# Patient Record
Sex: Female | Born: 1950
Health system: Southern US, Community
[De-identification: ages and names within clinical notes are randomized; demographics above are authoritative.]

## PROBLEM LIST (undated history)

## (undated) HISTORY — PX: AUGMENTATION MAMMAPLASTY: SUR837

## (undated) HISTORY — PX: ABDOMINAL HYSTERECTOMY: SHX81

---

## 2004-10-13 ENCOUNTER — Ambulatory Visit: Payer: Self-pay | Admitting: Obstetrics and Gynecology

## 2005-10-05 ENCOUNTER — Ambulatory Visit: Payer: Self-pay

## 2005-11-03 ENCOUNTER — Ambulatory Visit: Payer: Self-pay

## 2005-11-05 ENCOUNTER — Ambulatory Visit: Payer: Self-pay | Admitting: Internal Medicine

## 2006-01-13 ENCOUNTER — Ambulatory Visit: Payer: Self-pay | Admitting: Internal Medicine

## 2006-04-05 ENCOUNTER — Ambulatory Visit: Payer: Self-pay | Admitting: Orthopaedic Surgery

## 2006-11-09 ENCOUNTER — Ambulatory Visit: Payer: Self-pay | Admitting: Internal Medicine

## 2007-11-14 ENCOUNTER — Ambulatory Visit: Payer: Self-pay | Admitting: Internal Medicine

## 2008-02-03 ENCOUNTER — Emergency Department: Payer: Self-pay | Admitting: Internal Medicine

## 2008-02-03 ENCOUNTER — Other Ambulatory Visit: Payer: Self-pay

## 2008-11-26 ENCOUNTER — Ambulatory Visit: Payer: Self-pay | Admitting: Unknown Physician Specialty

## 2008-11-27 ENCOUNTER — Ambulatory Visit: Payer: Self-pay | Admitting: Internal Medicine

## 2009-04-11 ENCOUNTER — Ambulatory Visit: Payer: Self-pay | Admitting: Internal Medicine

## 2009-04-22 ENCOUNTER — Ambulatory Visit: Payer: Self-pay | Admitting: Unknown Physician Specialty

## 2009-11-28 ENCOUNTER — Ambulatory Visit: Payer: Self-pay | Admitting: Internal Medicine

## 2010-05-23 ENCOUNTER — Ambulatory Visit: Payer: Self-pay | Admitting: Unknown Physician Specialty

## 2010-12-24 ENCOUNTER — Ambulatory Visit: Payer: Self-pay | Admitting: Internal Medicine

## 2011-12-28 ENCOUNTER — Ambulatory Visit: Payer: Self-pay | Admitting: Internal Medicine

## 2012-04-13 ENCOUNTER — Ambulatory Visit: Payer: Self-pay | Admitting: Internal Medicine

## 2012-12-30 ENCOUNTER — Ambulatory Visit: Payer: Self-pay | Admitting: Internal Medicine

## 2014-05-24 ENCOUNTER — Ambulatory Visit: Payer: Self-pay | Admitting: Internal Medicine

## 2014-09-03 ENCOUNTER — Other Ambulatory Visit: Payer: Self-pay

## 2014-09-03 DIAGNOSIS — Z1239 Encounter for other screening for malignant neoplasm of breast: Secondary | ICD-10-CM

## 2014-09-19 ENCOUNTER — Other Ambulatory Visit: Payer: Self-pay

## 2014-09-19 ENCOUNTER — Ambulatory Visit
Admission: RE | Admit: 2014-09-19 | Discharge: 2014-09-19 | Disposition: A | Discharge status: Home / Self Care | Payer: BLUE CROSS/BLUE SHIELD | Source: Ambulatory Visit | Attending: Internal Medicine | Admitting: Internal Medicine

## 2014-09-19 DIAGNOSIS — Z9882 Breast implant status: Secondary | ICD-10-CM | POA: Diagnosis not present

## 2014-09-19 DIAGNOSIS — Z1239 Encounter for other screening for malignant neoplasm of breast: Secondary | ICD-10-CM

## 2014-09-19 DIAGNOSIS — Z1231 Encounter for screening mammogram for malignant neoplasm of breast: Secondary | ICD-10-CM | POA: Insufficient documentation

## 2015-10-28 ENCOUNTER — Other Ambulatory Visit: Payer: Self-pay | Admitting: Internal Medicine

## 2015-10-28 DIAGNOSIS — Z1231 Encounter for screening mammogram for malignant neoplasm of breast: Secondary | ICD-10-CM

## 2015-11-11 ENCOUNTER — Ambulatory Visit: Payer: BLUE CROSS/BLUE SHIELD

## 2015-11-26 ENCOUNTER — Ambulatory Visit: Admission: RE | Admit: 2015-11-26 | Payer: BLUE CROSS/BLUE SHIELD | Source: Ambulatory Visit

## 2015-12-18 ENCOUNTER — Other Ambulatory Visit: Payer: Self-pay | Admitting: Internal Medicine

## 2015-12-18 ENCOUNTER — Ambulatory Visit
Admission: RE | Admit: 2015-12-18 | Discharge: 2015-12-18 | Disposition: A | Discharge status: Home / Self Care | Payer: BLUE CROSS/BLUE SHIELD | Source: Ambulatory Visit | Attending: Internal Medicine | Admitting: Internal Medicine

## 2015-12-18 DIAGNOSIS — Z1231 Encounter for screening mammogram for malignant neoplasm of breast: Secondary | ICD-10-CM | POA: Diagnosis not present

## 2016-03-12 ENCOUNTER — Other Ambulatory Visit: Payer: Self-pay | Admitting: Physician Assistant

## 2016-03-12 ENCOUNTER — Ambulatory Visit: Payer: BLUE CROSS/BLUE SHIELD

## 2016-03-12 DIAGNOSIS — R1011 Right upper quadrant pain: Secondary | ICD-10-CM

## 2016-09-08 DIAGNOSIS — Z01419 Encounter for gynecological examination (general) (routine) without abnormal findings: Secondary | ICD-10-CM | POA: Diagnosis not present

## 2016-09-08 DIAGNOSIS — Z1211 Encounter for screening for malignant neoplasm of colon: Secondary | ICD-10-CM | POA: Diagnosis not present

## 2016-09-08 DIAGNOSIS — Z8639 Personal history of other endocrine, nutritional and metabolic disease: Secondary | ICD-10-CM | POA: Diagnosis not present

## 2016-09-08 DIAGNOSIS — Z124 Encounter for screening for malignant neoplasm of cervix: Secondary | ICD-10-CM | POA: Diagnosis not present

## 2016-09-09 ENCOUNTER — Other Ambulatory Visit: Payer: Self-pay | Admitting: Obstetrics and Gynecology

## 2016-09-09 DIAGNOSIS — Z1231 Encounter for screening mammogram for malignant neoplasm of breast: Secondary | ICD-10-CM

## 2016-12-04 DIAGNOSIS — R1013 Epigastric pain: Secondary | ICD-10-CM | POA: Diagnosis not present

## 2016-12-07 ENCOUNTER — Other Ambulatory Visit: Payer: Self-pay | Admitting: Internal Medicine

## 2016-12-07 DIAGNOSIS — R1013 Epigastric pain: Secondary | ICD-10-CM

## 2016-12-10 ENCOUNTER — Ambulatory Visit
Admission: RE | Admit: 2016-12-10 | Discharge: 2016-12-10 | Disposition: A | Discharge status: Home / Self Care | Payer: PPO | Source: Ambulatory Visit | Attending: Internal Medicine | Admitting: Internal Medicine

## 2016-12-10 DIAGNOSIS — R1013 Epigastric pain: Secondary | ICD-10-CM | POA: Diagnosis not present

## 2016-12-16 DIAGNOSIS — R1013 Epigastric pain: Secondary | ICD-10-CM | POA: Diagnosis not present

## 2016-12-16 DIAGNOSIS — R1084 Generalized abdominal pain: Secondary | ICD-10-CM | POA: Diagnosis not present

## 2016-12-17 ENCOUNTER — Other Ambulatory Visit: Payer: Self-pay | Admitting: Internal Medicine

## 2016-12-17 DIAGNOSIS — R1084 Generalized abdominal pain: Secondary | ICD-10-CM

## 2016-12-21 ENCOUNTER — Ambulatory Visit
Admission: RE | Admit: 2016-12-21 | Discharge: 2016-12-21 | Disposition: A | Discharge status: Home / Self Care | Payer: PPO | Source: Ambulatory Visit | Attending: Obstetrics and Gynecology | Admitting: Obstetrics and Gynecology

## 2016-12-21 DIAGNOSIS — Z1231 Encounter for screening mammogram for malignant neoplasm of breast: Secondary | ICD-10-CM | POA: Insufficient documentation

## 2016-12-21 DIAGNOSIS — R1084 Generalized abdominal pain: Secondary | ICD-10-CM | POA: Diagnosis not present

## 2016-12-30 ENCOUNTER — Ambulatory Visit
Admission: RE | Admit: 2016-12-30 | Discharge: 2016-12-30 | Disposition: A | Discharge status: Home / Self Care | Payer: PPO | Source: Ambulatory Visit | Attending: Internal Medicine | Admitting: Internal Medicine

## 2016-12-30 DIAGNOSIS — D7389 Other diseases of spleen: Secondary | ICD-10-CM | POA: Insufficient documentation

## 2016-12-30 DIAGNOSIS — R1084 Generalized abdominal pain: Secondary | ICD-10-CM | POA: Diagnosis present

## 2016-12-30 DIAGNOSIS — K5730 Diverticulosis of large intestine without perforation or abscess without bleeding: Secondary | ICD-10-CM | POA: Diagnosis not present

## 2017-01-08 DIAGNOSIS — H43813 Vitreous degeneration, bilateral: Secondary | ICD-10-CM | POA: Diagnosis not present

## 2017-01-08 DIAGNOSIS — I10 Essential (primary) hypertension: Secondary | ICD-10-CM | POA: Diagnosis not present

## 2017-02-15 DIAGNOSIS — Z79899 Other long term (current) drug therapy: Secondary | ICD-10-CM | POA: Diagnosis not present

## 2017-02-15 DIAGNOSIS — I10 Essential (primary) hypertension: Secondary | ICD-10-CM | POA: Diagnosis not present

## 2017-02-15 DIAGNOSIS — Z0000 Encounter for general adult medical examination without abnormal findings: Secondary | ICD-10-CM | POA: Diagnosis not present

## 2017-02-15 DIAGNOSIS — E7800 Pure hypercholesterolemia, unspecified: Secondary | ICD-10-CM | POA: Diagnosis not present

## 2017-03-11 DIAGNOSIS — Z79899 Other long term (current) drug therapy: Secondary | ICD-10-CM | POA: Diagnosis not present

## 2017-03-11 DIAGNOSIS — I10 Essential (primary) hypertension: Secondary | ICD-10-CM | POA: Diagnosis not present

## 2017-03-11 DIAGNOSIS — E7800 Pure hypercholesterolemia, unspecified: Secondary | ICD-10-CM | POA: Diagnosis not present

## 2017-03-29 DIAGNOSIS — H1033 Unspecified acute conjunctivitis, bilateral: Secondary | ICD-10-CM | POA: Diagnosis not present

## 2017-04-05 DIAGNOSIS — H1033 Unspecified acute conjunctivitis, bilateral: Secondary | ICD-10-CM | POA: Diagnosis not present

## 2017-05-07 ENCOUNTER — Other Ambulatory Visit: Payer: Self-pay | Admitting: Internal Medicine

## 2017-05-07 DIAGNOSIS — Z1231 Encounter for screening mammogram for malignant neoplasm of breast: Secondary | ICD-10-CM

## 2017-07-22 DIAGNOSIS — L820 Inflamed seborrheic keratosis: Secondary | ICD-10-CM | POA: Diagnosis not present

## 2017-07-22 DIAGNOSIS — D229 Melanocytic nevi, unspecified: Secondary | ICD-10-CM | POA: Diagnosis not present

## 2017-07-30 DIAGNOSIS — E7800 Pure hypercholesterolemia, unspecified: Secondary | ICD-10-CM | POA: Diagnosis not present

## 2017-07-30 DIAGNOSIS — I10 Essential (primary) hypertension: Secondary | ICD-10-CM | POA: Diagnosis not present

## 2017-08-24 DIAGNOSIS — M79671 Pain in right foot: Secondary | ICD-10-CM | POA: Diagnosis not present

## 2017-09-10 DIAGNOSIS — E7800 Pure hypercholesterolemia, unspecified: Secondary | ICD-10-CM | POA: Diagnosis not present

## 2017-09-10 DIAGNOSIS — I10 Essential (primary) hypertension: Secondary | ICD-10-CM | POA: Diagnosis not present

## 2017-12-22 ENCOUNTER — Ambulatory Visit
Admission: RE | Admit: 2017-12-22 | Discharge: 2017-12-22 | Disposition: A | Discharge status: Home / Self Care | Payer: PPO | Source: Ambulatory Visit | Attending: Internal Medicine | Admitting: Internal Medicine

## 2017-12-22 DIAGNOSIS — Z1231 Encounter for screening mammogram for malignant neoplasm of breast: Secondary | ICD-10-CM | POA: Diagnosis not present

## 2018-04-13 DIAGNOSIS — E7800 Pure hypercholesterolemia, unspecified: Secondary | ICD-10-CM | POA: Diagnosis not present

## 2018-04-13 DIAGNOSIS — M79672 Pain in left foot: Secondary | ICD-10-CM | POA: Diagnosis not present

## 2018-04-13 DIAGNOSIS — Z79899 Other long term (current) drug therapy: Secondary | ICD-10-CM | POA: Diagnosis not present

## 2018-04-13 DIAGNOSIS — Z13820 Encounter for screening for osteoporosis: Secondary | ICD-10-CM | POA: Diagnosis not present

## 2018-04-13 DIAGNOSIS — I10 Essential (primary) hypertension: Secondary | ICD-10-CM | POA: Diagnosis not present

## 2018-05-10 DIAGNOSIS — M8588 Other specified disorders of bone density and structure, other site: Secondary | ICD-10-CM | POA: Diagnosis not present

## 2018-11-02 DIAGNOSIS — L239 Allergic contact dermatitis, unspecified cause: Secondary | ICD-10-CM | POA: Diagnosis not present

## 2019-01-30 DIAGNOSIS — I10 Essential (primary) hypertension: Secondary | ICD-10-CM | POA: Diagnosis not present

## 2019-01-30 DIAGNOSIS — K635 Polyp of colon: Secondary | ICD-10-CM | POA: Diagnosis not present

## 2019-01-30 DIAGNOSIS — Z79899 Other long term (current) drug therapy: Secondary | ICD-10-CM | POA: Diagnosis not present

## 2019-01-30 DIAGNOSIS — Z1239 Encounter for other screening for malignant neoplasm of breast: Secondary | ICD-10-CM | POA: Diagnosis not present

## 2019-01-30 DIAGNOSIS — E7800 Pure hypercholesterolemia, unspecified: Secondary | ICD-10-CM | POA: Diagnosis not present

## 2019-01-30 DIAGNOSIS — Z23 Encounter for immunization: Secondary | ICD-10-CM | POA: Diagnosis not present

## 2019-02-23 ENCOUNTER — Other Ambulatory Visit: Payer: Self-pay | Admitting: Internal Medicine

## 2019-02-23 DIAGNOSIS — Z79899 Other long term (current) drug therapy: Secondary | ICD-10-CM | POA: Diagnosis not present

## 2019-02-23 DIAGNOSIS — I10 Essential (primary) hypertension: Secondary | ICD-10-CM | POA: Diagnosis not present

## 2019-02-23 DIAGNOSIS — Z1231 Encounter for screening mammogram for malignant neoplasm of breast: Secondary | ICD-10-CM

## 2019-02-23 DIAGNOSIS — E7800 Pure hypercholesterolemia, unspecified: Secondary | ICD-10-CM | POA: Diagnosis not present

## 2019-05-03 DIAGNOSIS — Z86010 Personal history of colonic polyps: Secondary | ICD-10-CM | POA: Diagnosis not present

## 2019-05-03 DIAGNOSIS — Z8371 Family history of colonic polyps: Secondary | ICD-10-CM | POA: Diagnosis not present

## 2019-05-03 DIAGNOSIS — Z01812 Encounter for preprocedural laboratory examination: Secondary | ICD-10-CM | POA: Diagnosis not present

## 2019-05-18 ENCOUNTER — Ambulatory Visit
Admission: RE | Admit: 2019-05-18 | Discharge: 2019-05-18 | Disposition: A | Discharge status: Home / Self Care | Payer: Medicare HMO | Source: Ambulatory Visit | Attending: Internal Medicine | Admitting: Internal Medicine

## 2019-05-18 DIAGNOSIS — Z1231 Encounter for screening mammogram for malignant neoplasm of breast: Secondary | ICD-10-CM | POA: Insufficient documentation

## 2019-05-30 DIAGNOSIS — Z01812 Encounter for preprocedural laboratory examination: Secondary | ICD-10-CM | POA: Diagnosis not present

## 2019-06-02 DIAGNOSIS — Z20822 Contact with and (suspected) exposure to covid-19: Secondary | ICD-10-CM | POA: Diagnosis not present

## 2019-06-02 DIAGNOSIS — Z1211 Encounter for screening for malignant neoplasm of colon: Secondary | ICD-10-CM | POA: Diagnosis not present

## 2019-06-02 DIAGNOSIS — Z86010 Personal history of colonic polyps: Secondary | ICD-10-CM | POA: Diagnosis not present

## 2019-06-02 DIAGNOSIS — Z8371 Family history of colonic polyps: Secondary | ICD-10-CM | POA: Diagnosis not present

## 2019-07-14 DIAGNOSIS — G43109 Migraine with aura, not intractable, without status migrainosus: Secondary | ICD-10-CM | POA: Diagnosis not present

## 2019-08-01 DIAGNOSIS — Z79890 Hormone replacement therapy (postmenopausal): Secondary | ICD-10-CM | POA: Diagnosis not present

## 2019-08-01 DIAGNOSIS — Z0000 Encounter for general adult medical examination without abnormal findings: Secondary | ICD-10-CM | POA: Diagnosis not present

## 2019-08-01 DIAGNOSIS — E079 Disorder of thyroid, unspecified: Secondary | ICD-10-CM | POA: Diagnosis not present

## 2019-08-01 DIAGNOSIS — Z79899 Other long term (current) drug therapy: Secondary | ICD-10-CM | POA: Diagnosis not present

## 2020-01-10 DIAGNOSIS — N289 Disorder of kidney and ureter, unspecified: Secondary | ICD-10-CM | POA: Diagnosis not present

## 2020-01-30 DIAGNOSIS — Z20822 Contact with and (suspected) exposure to covid-19: Secondary | ICD-10-CM | POA: Diagnosis not present

## 2020-02-01 DIAGNOSIS — M549 Dorsalgia, unspecified: Secondary | ICD-10-CM | POA: Diagnosis not present

## 2020-02-01 DIAGNOSIS — M5432 Sciatica, left side: Secondary | ICD-10-CM | POA: Diagnosis not present

## 2020-02-01 DIAGNOSIS — Z79899 Other long term (current) drug therapy: Secondary | ICD-10-CM | POA: Diagnosis not present

## 2020-02-01 DIAGNOSIS — M160 Bilateral primary osteoarthritis of hip: Secondary | ICD-10-CM | POA: Diagnosis not present

## 2020-02-01 DIAGNOSIS — M25552 Pain in left hip: Secondary | ICD-10-CM | POA: Diagnosis not present

## 2020-02-01 DIAGNOSIS — M419 Scoliosis, unspecified: Secondary | ICD-10-CM | POA: Diagnosis not present

## 2020-02-01 DIAGNOSIS — Z1231 Encounter for screening mammogram for malignant neoplasm of breast: Secondary | ICD-10-CM | POA: Diagnosis not present

## 2020-02-01 DIAGNOSIS — Z23 Encounter for immunization: Secondary | ICD-10-CM | POA: Diagnosis not present

## 2020-02-01 DIAGNOSIS — M47816 Spondylosis without myelopathy or radiculopathy, lumbar region: Secondary | ICD-10-CM | POA: Diagnosis not present

## 2020-02-27 ENCOUNTER — Other Ambulatory Visit: Payer: Self-pay | Admitting: Internal Medicine

## 2020-02-27 DIAGNOSIS — Z1231 Encounter for screening mammogram for malignant neoplasm of breast: Secondary | ICD-10-CM

## 2020-03-16 ENCOUNTER — Ambulatory Visit: Payer: Medicare HMO | Attending: Internal Medicine

## 2020-03-16 DIAGNOSIS — Z23 Encounter for immunization: Secondary | ICD-10-CM

## 2020-03-16 NOTE — Progress Notes (Signed)
   Covid-19 Vaccination Clinic  Name:  Alexis Blevins    MRN: [id number] DOB: 07-Sep-1950  03/16/2020  [name] was observed post Covid-19 immunization for 15 minutes without incident. She was provided with Vaccine Information Sheet and instruction to access the V-Safe system.   [name] was instructed to call 911 with any severe reactions post vaccine: Marland Kitchen Difficulty breathing  . Swelling of face and throat  . A fast heartbeat  . A bad rash all over body  . Dizziness and weakness   Immunizations Administered    No immunizations on file.

## 2020-05-20 ENCOUNTER — Other Ambulatory Visit: Payer: Self-pay

## 2020-05-20 ENCOUNTER — Ambulatory Visit
Admission: RE | Admit: 2020-05-20 | Discharge: 2020-05-20 | Disposition: A | Discharge status: Home / Self Care | Payer: Medicare HMO | Source: Ambulatory Visit | Attending: Internal Medicine | Admitting: Internal Medicine

## 2020-05-20 DIAGNOSIS — Z1231 Encounter for screening mammogram for malignant neoplasm of breast: Secondary | ICD-10-CM | POA: Insufficient documentation

## 2020-09-22 DIAGNOSIS — S93602A Unspecified sprain of left foot, initial encounter: Secondary | ICD-10-CM | POA: Diagnosis not present

## 2020-12-03 DIAGNOSIS — E079 Disorder of thyroid, unspecified: Secondary | ICD-10-CM | POA: Diagnosis not present

## 2020-12-03 DIAGNOSIS — Z0000 Encounter for general adult medical examination without abnormal findings: Secondary | ICD-10-CM | POA: Diagnosis not present

## 2020-12-03 DIAGNOSIS — E785 Hyperlipidemia, unspecified: Secondary | ICD-10-CM | POA: Diagnosis not present

## 2020-12-03 DIAGNOSIS — Z79899 Other long term (current) drug therapy: Secondary | ICD-10-CM | POA: Diagnosis not present

## 2020-12-03 DIAGNOSIS — Z13820 Encounter for screening for osteoporosis: Secondary | ICD-10-CM | POA: Diagnosis not present

## 2020-12-19 DIAGNOSIS — M8588 Other specified disorders of bone density and structure, other site: Secondary | ICD-10-CM | POA: Diagnosis not present

## 2021-01-30 ENCOUNTER — Ambulatory Visit (INDEPENDENT_AMBULATORY_CARE_PROVIDER_SITE_OTHER): Payer: Self-pay | Admitting: Dermatology

## 2021-01-30 ENCOUNTER — Other Ambulatory Visit: Payer: Self-pay

## 2021-01-30 DIAGNOSIS — L988 Other specified disorders of the skin and subcutaneous tissue: Secondary | ICD-10-CM

## 2021-01-30 NOTE — Patient Instructions (Signed)

## 2021-01-30 NOTE — Progress Notes (Signed)
   New Patient Visit  Subjective  Alexis Blevins is a 70 y.o. female who presents for the following: Facial Elastosis (Face, pt presents for fillers today).  The following portions of the chart were reviewed this encounter and updated as appropriate:   Allergies  Meds  Problems  Med Hx  Surg Hx  Fam Hx     Review of Systems:  No other skin or systemic complaints except as noted in HPI or Assessment and Plan.  Objective  Well appearing patient in no apparent distress; mood and affect are within normal limits.  A focused examination was performed including face. Relevant physical exam findings are noted in the Assessment and Plan.  face Rhytides and volume loss.                                   Assessment & Plan  Elastosis of skin face  Discussed fillers with pt, and discussed potential for bruising.   Restylane Refyne 1cc injected today to - bil oral commissures - chin crease - upper and lower lip vermillion pillows Lot [id number] exp 08/31/2021  Filling material injection - face Prior to the procedure, the patient's past medical history, allergies and the rare but potential risks and complications were reviewed with the patient and a signed consent was obtained. Pre and post-treatment care was discussed and instructions provided.  Location: lips- upper and lower lip pillows, oral commissures, and chin  Filler Type: Restylane refyne  Procedure: Lidocaine-tetracaine ointment was applied to treatment areas to achieve good local anesthesia. The area was prepped thoroughly with Puracyn. After introducing the needle into the desired treatment area, the syringe plunger was drawn back to ensure there was no flash of blood prior to injecting the filler in order to minimize risk of intravascular injection and vascular occlusion. After injection of the filler, the treated areas were cleansed and iced to reduce swelling. Post-treatment instructions were  reviewed with the patient.       Patient tolerated the procedure well. The patient will call with any problems, questions or concerns prior to their next appointment.   Return for to be scheduled for TBSE.  I, Othelia Pulling, RMA, am acting as scribe for Sarina Ser, MD . Documentation: I have reviewed the above documentation for accuracy and completeness, and I agree with the above.  Sarina Ser, MD

## 2021-01-31 ENCOUNTER — Encounter: Payer: Self-pay | Admitting: Dermatology

## 2021-03-07 IMAGING — MG DIGITAL SCREENING BREAST BILAT IMPLANT W/ TOMO W/ CAD
9 of 12 series · 9 of 28 positions shown · non-contrast
Comparison: Previous exam(s).

CLINICAL DATA: Screening.

EXAM:
DIGITAL SCREENING BILATERAL MAMMOGRAM WITH IMPLANTS, CAD AND TOMO
The patient has retropectoral implants. Standard and implant
displaced views were performed.

[L CC]
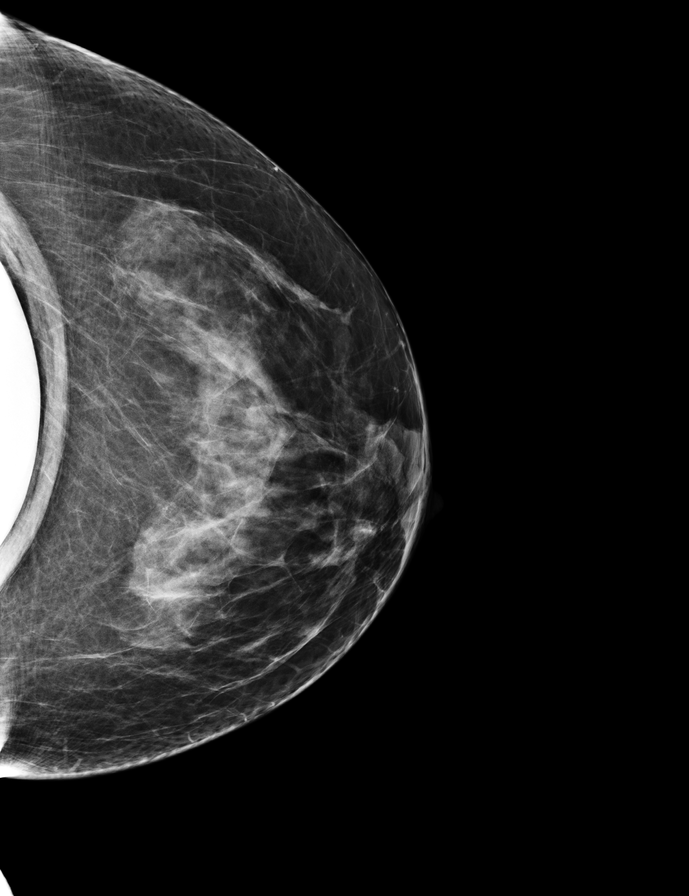

[R CC]
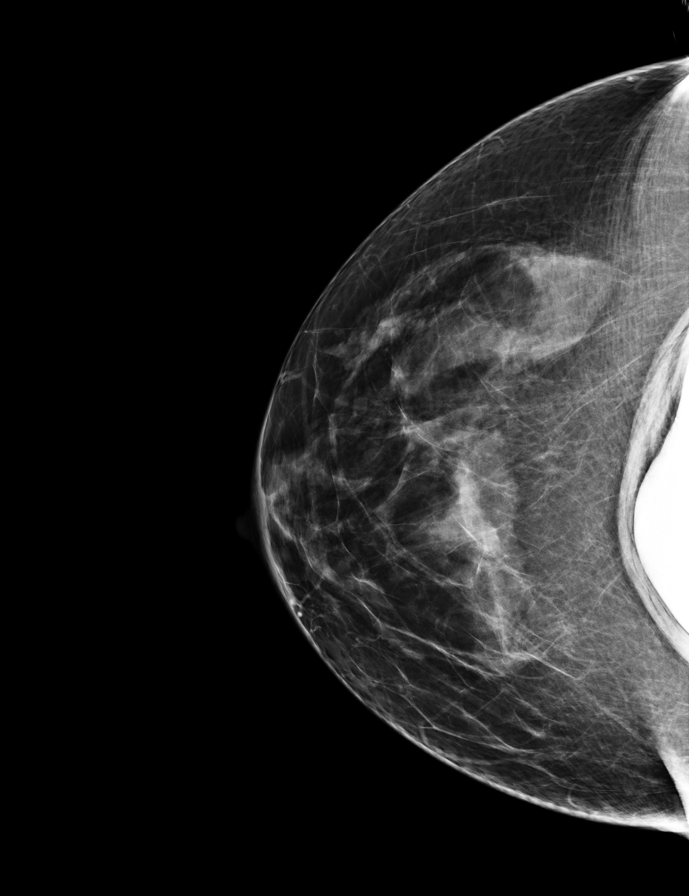

[R MLO]
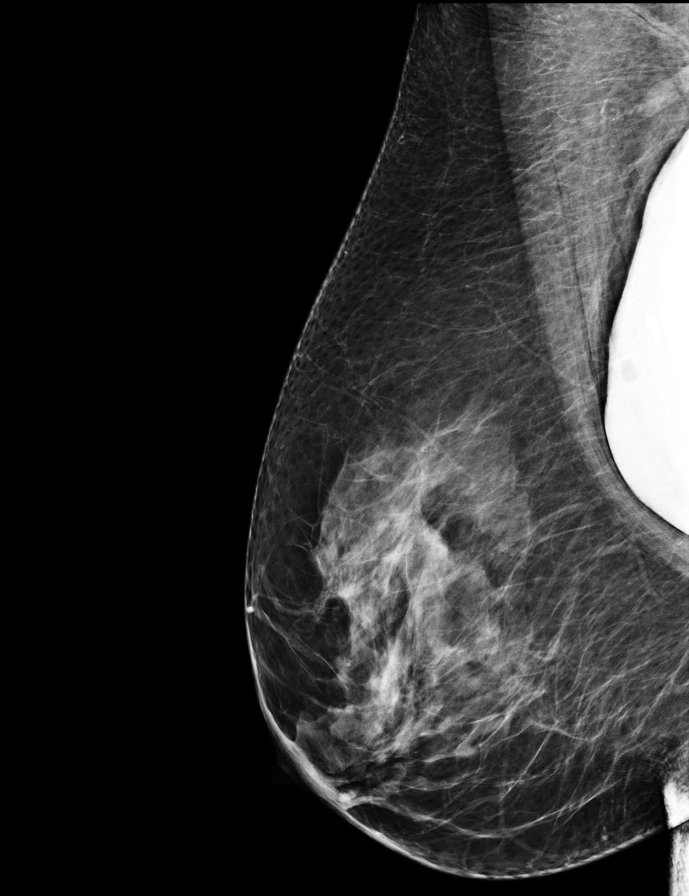

[L MLO]
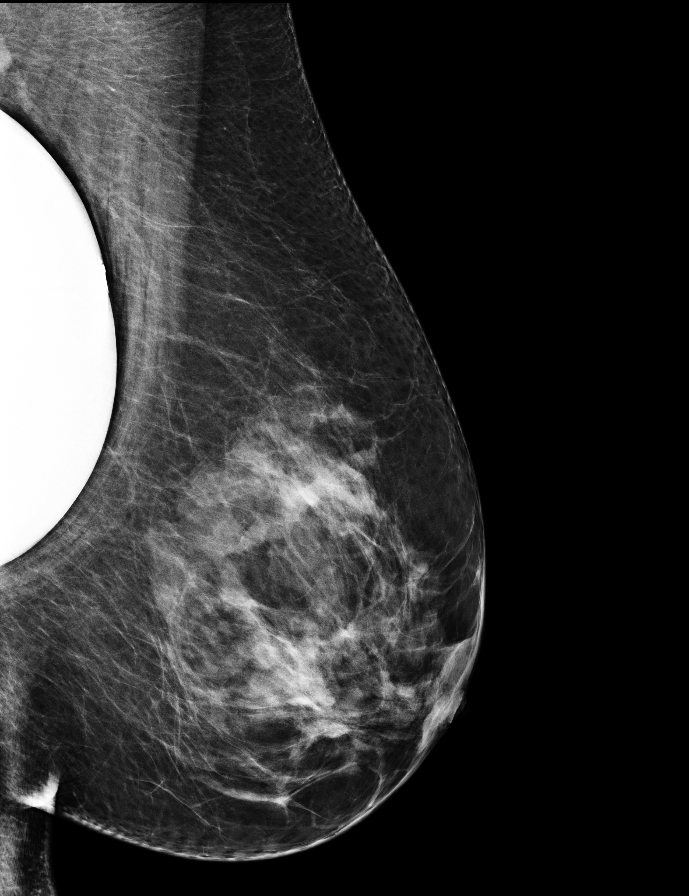

[L CC synth-2D]
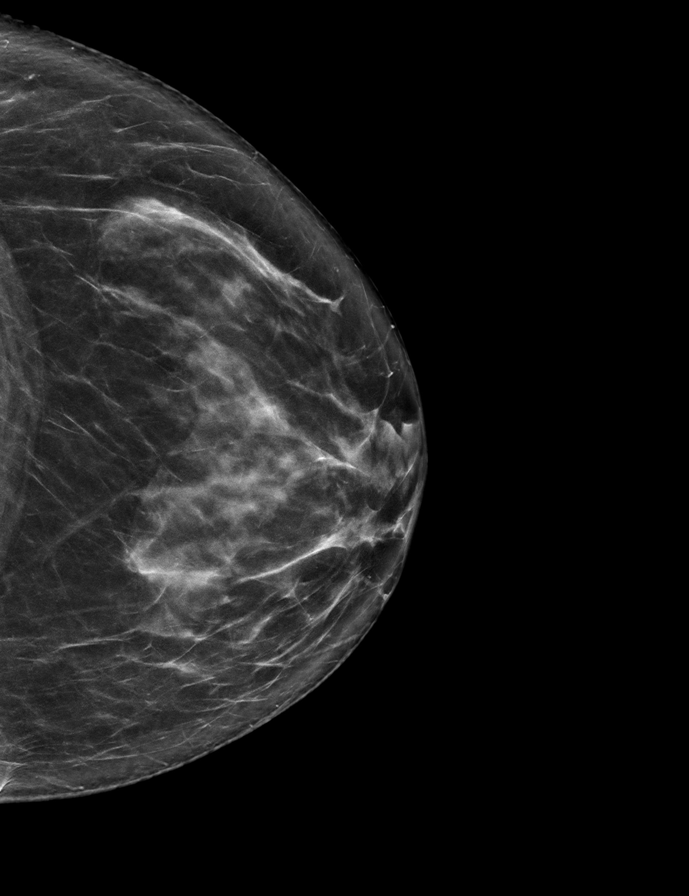

[L MLO synth-2D]
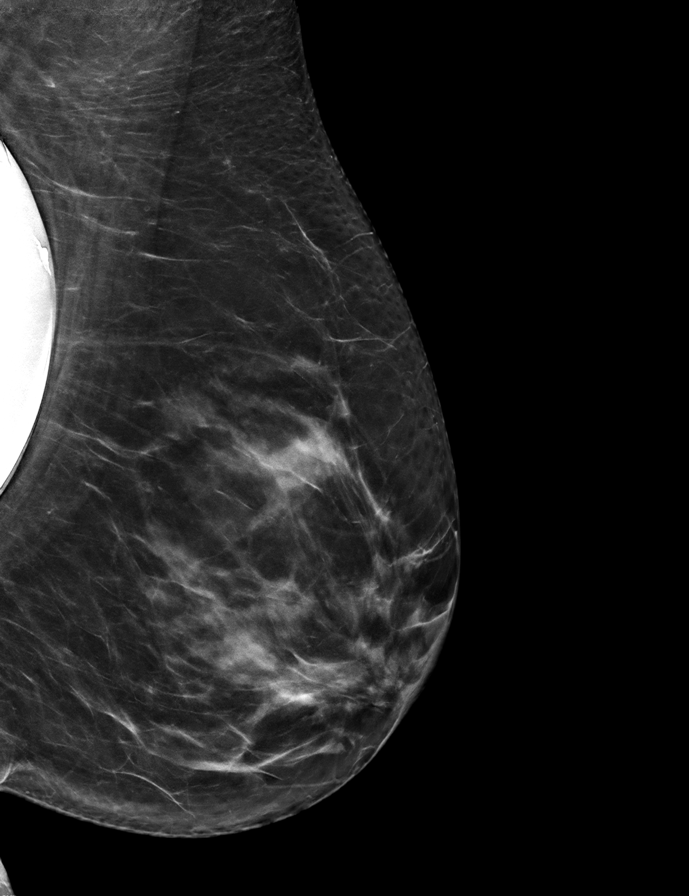

[R MLO synth-2D]
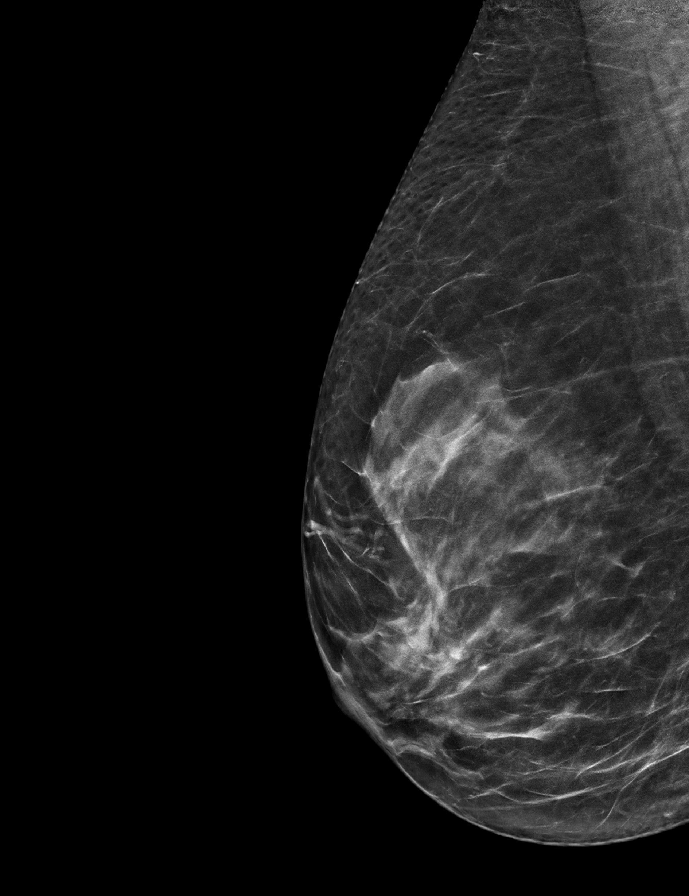

[R CC synth-2D]
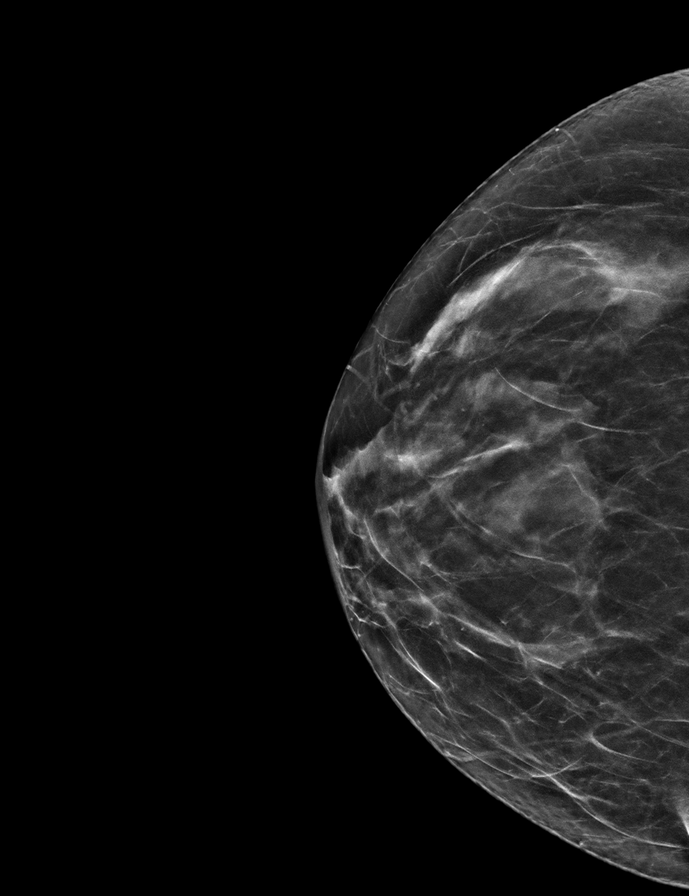

[L MLO tomo · tomo slice 35/69.0]
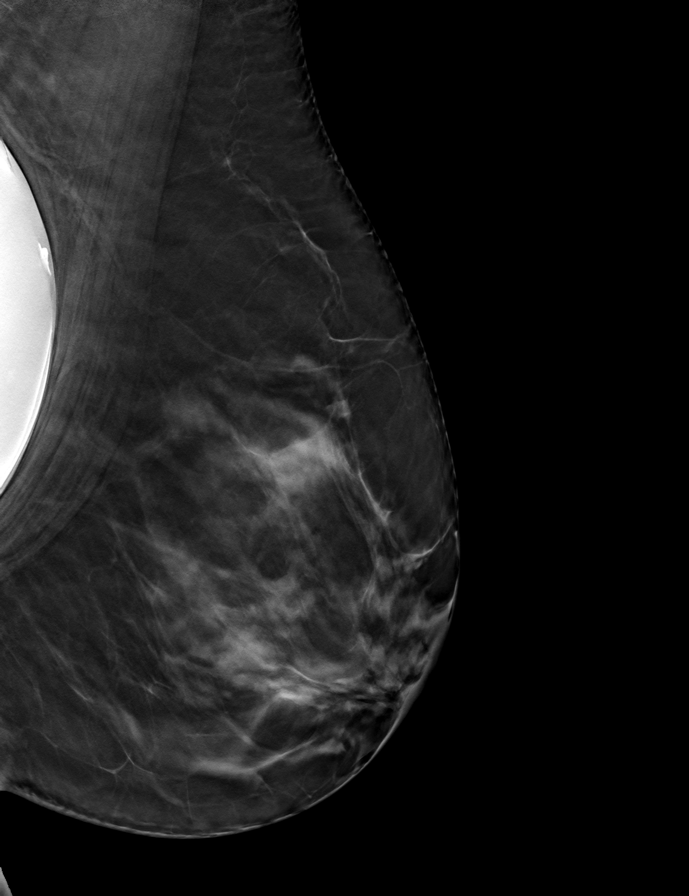

[9 of 28 positions shown; findings below may reference images not displayed]

ACR Breast Density Category c: The breast tissue is heterogeneously
dense, which may obscure small masses.
FINDINGS: There are no findings suspicious for malignancy. Images were
processed with CAD.
IMPRESSION: No mammographic evidence of malignancy. A result letter of this
screening mammogram will be mailed directly to the patient.

RECOMMENDATION:
Screening mammogram in one year. (Code:49-X-OQ9)

BI-RADS CATEGORY  1:  Negative.

## 2021-04-21 ENCOUNTER — Ambulatory Visit: Payer: Medicare HMO | Admitting: Dermatology

## 2021-05-01 DIAGNOSIS — H169 Unspecified keratitis: Secondary | ICD-10-CM | POA: Diagnosis not present

## 2021-05-13 ENCOUNTER — Other Ambulatory Visit: Payer: Self-pay | Admitting: Internal Medicine

## 2021-05-13 DIAGNOSIS — Z1231 Encounter for screening mammogram for malignant neoplasm of breast: Secondary | ICD-10-CM

## 2021-05-27 ENCOUNTER — Other Ambulatory Visit: Payer: Self-pay | Admitting: Internal Medicine

## 2021-05-27 DIAGNOSIS — N644 Mastodynia: Secondary | ICD-10-CM

## 2021-06-05 ENCOUNTER — Other Ambulatory Visit: Payer: Self-pay | Admitting: Internal Medicine

## 2021-06-05 DIAGNOSIS — I10 Essential (primary) hypertension: Secondary | ICD-10-CM | POA: Diagnosis not present

## 2021-06-05 DIAGNOSIS — M2578 Osteophyte, vertebrae: Secondary | ICD-10-CM | POA: Diagnosis not present

## 2021-06-05 DIAGNOSIS — E7800 Pure hypercholesterolemia, unspecified: Secondary | ICD-10-CM | POA: Diagnosis not present

## 2021-06-05 DIAGNOSIS — M542 Cervicalgia: Secondary | ICD-10-CM | POA: Diagnosis not present

## 2021-06-05 DIAGNOSIS — Z79899 Other long term (current) drug therapy: Secondary | ICD-10-CM | POA: Diagnosis not present

## 2021-06-05 DIAGNOSIS — M25512 Pain in left shoulder: Secondary | ICD-10-CM | POA: Diagnosis not present

## 2021-06-05 DIAGNOSIS — E039 Hypothyroidism, unspecified: Secondary | ICD-10-CM | POA: Diagnosis not present

## 2021-06-05 DIAGNOSIS — Z1231 Encounter for screening mammogram for malignant neoplasm of breast: Secondary | ICD-10-CM

## 2021-06-06 ENCOUNTER — Other Ambulatory Visit: Payer: Self-pay

## 2021-06-06 ENCOUNTER — Ambulatory Visit
Admission: RE | Admit: 2021-06-06 | Discharge: 2021-06-06 | Disposition: A | Discharge status: Home / Self Care | Payer: Medicare HMO | Source: Ambulatory Visit | Attending: Internal Medicine | Admitting: Internal Medicine

## 2021-06-06 DIAGNOSIS — Z1231 Encounter for screening mammogram for malignant neoplasm of breast: Secondary | ICD-10-CM | POA: Insufficient documentation

## 2021-06-20 DIAGNOSIS — H169 Unspecified keratitis: Secondary | ICD-10-CM | POA: Diagnosis not present

## 2021-06-20 DIAGNOSIS — H524 Presbyopia: Secondary | ICD-10-CM | POA: Diagnosis not present

## 2021-07-21 ENCOUNTER — Ambulatory Visit: Payer: Medicare HMO | Admitting: Dermatology

## 2022-01-19 DIAGNOSIS — Z79899 Other long term (current) drug therapy: Secondary | ICD-10-CM | POA: Diagnosis not present

## 2022-01-19 DIAGNOSIS — M25512 Pain in left shoulder: Secondary | ICD-10-CM | POA: Diagnosis not present

## 2022-01-19 DIAGNOSIS — E785 Hyperlipidemia, unspecified: Secondary | ICD-10-CM | POA: Diagnosis not present

## 2022-01-19 DIAGNOSIS — Z23 Encounter for immunization: Secondary | ICD-10-CM | POA: Diagnosis not present

## 2022-01-19 DIAGNOSIS — I10 Essential (primary) hypertension: Secondary | ICD-10-CM | POA: Diagnosis not present

## 2022-01-19 DIAGNOSIS — Z0000 Encounter for general adult medical examination without abnormal findings: Secondary | ICD-10-CM | POA: Diagnosis not present

## 2022-01-19 DIAGNOSIS — E079 Disorder of thyroid, unspecified: Secondary | ICD-10-CM | POA: Diagnosis not present

## 2022-02-13 DIAGNOSIS — E039 Hypothyroidism, unspecified: Secondary | ICD-10-CM | POA: Diagnosis not present

## 2022-02-13 DIAGNOSIS — Z79899 Other long term (current) drug therapy: Secondary | ICD-10-CM | POA: Diagnosis not present

## 2022-02-13 DIAGNOSIS — I10 Essential (primary) hypertension: Secondary | ICD-10-CM | POA: Diagnosis not present

## 2022-02-13 DIAGNOSIS — E7800 Pure hypercholesterolemia, unspecified: Secondary | ICD-10-CM | POA: Diagnosis not present

## 2022-05-11 ENCOUNTER — Other Ambulatory Visit: Payer: Self-pay | Admitting: Internal Medicine

## 2022-05-11 DIAGNOSIS — Z1231 Encounter for screening mammogram for malignant neoplasm of breast: Secondary | ICD-10-CM

## 2022-06-08 ENCOUNTER — Ambulatory Visit
Admission: RE | Admit: 2022-06-08 | Discharge: 2022-06-08 | Disposition: A | Discharge status: Home / Self Care | Payer: Medicare HMO | Source: Ambulatory Visit | Attending: Internal Medicine | Admitting: Internal Medicine

## 2022-06-08 DIAGNOSIS — Z1231 Encounter for screening mammogram for malignant neoplasm of breast: Secondary | ICD-10-CM | POA: Insufficient documentation

## 2022-06-12 DIAGNOSIS — H524 Presbyopia: Secondary | ICD-10-CM | POA: Diagnosis not present

## 2022-06-12 DIAGNOSIS — H269 Unspecified cataract: Secondary | ICD-10-CM | POA: Diagnosis not present

## 2022-07-20 DIAGNOSIS — I10 Essential (primary) hypertension: Secondary | ICD-10-CM | POA: Diagnosis not present

## 2022-07-20 DIAGNOSIS — Z79899 Other long term (current) drug therapy: Secondary | ICD-10-CM | POA: Diagnosis not present

## 2022-07-20 DIAGNOSIS — E039 Hypothyroidism, unspecified: Secondary | ICD-10-CM | POA: Diagnosis not present

## 2022-07-20 DIAGNOSIS — E7800 Pure hypercholesterolemia, unspecified: Secondary | ICD-10-CM | POA: Diagnosis not present

## 2022-07-20 DIAGNOSIS — Z0000 Encounter for general adult medical examination without abnormal findings: Secondary | ICD-10-CM | POA: Diagnosis not present

## 2022-07-24 DIAGNOSIS — Z03818 Encounter for observation for suspected exposure to other biological agents ruled out: Secondary | ICD-10-CM | POA: Diagnosis not present

## 2022-08-21 DIAGNOSIS — E039 Hypothyroidism, unspecified: Secondary | ICD-10-CM | POA: Diagnosis not present

## 2022-08-21 DIAGNOSIS — E7800 Pure hypercholesterolemia, unspecified: Secondary | ICD-10-CM | POA: Diagnosis not present

## 2022-08-21 DIAGNOSIS — Z79899 Other long term (current) drug therapy: Secondary | ICD-10-CM | POA: Diagnosis not present

## 2022-10-16 DIAGNOSIS — Z79899 Other long term (current) drug therapy: Secondary | ICD-10-CM | POA: Diagnosis not present

## 2022-10-22 DIAGNOSIS — S92911A Unspecified fracture of right toe(s), initial encounter for closed fracture: Secondary | ICD-10-CM | POA: Diagnosis not present

## 2022-10-22 DIAGNOSIS — M1711 Unilateral primary osteoarthritis, right knee: Secondary | ICD-10-CM | POA: Diagnosis not present

## 2022-10-22 DIAGNOSIS — M25561 Pain in right knee: Secondary | ICD-10-CM | POA: Diagnosis not present

## 2022-10-22 DIAGNOSIS — M79674 Pain in right toe(s): Secondary | ICD-10-CM | POA: Diagnosis not present

## 2022-12-24 ENCOUNTER — Ambulatory Visit: Payer: Medicare HMO | Admitting: Dermatology

## 2022-12-24 VITALS — BP 112/62 | HR 69

## 2022-12-24 DIAGNOSIS — L821 Other seborrheic keratosis: Secondary | ICD-10-CM | POA: Diagnosis not present

## 2022-12-24 DIAGNOSIS — L578 Other skin changes due to chronic exposure to nonionizing radiation: Secondary | ICD-10-CM | POA: Diagnosis not present

## 2022-12-24 DIAGNOSIS — L820 Inflamed seborrheic keratosis: Secondary | ICD-10-CM | POA: Diagnosis not present

## 2022-12-24 NOTE — Progress Notes (Signed)
   Follow-Up Visit   Subjective  Alexis Blevins is a 72 y.o. female who presents for the following: spot at right inner thigh that scaly and itchy   The patient has spots, moles and lesions to be evaluated, some may be new or changing and the patient may have concern these could be cancer.  The following portions of the chart were reviewed this encounter and updated as appropriate: medications, allergies, medical history  Review of Systems:  No other skin or systemic complaints except as noted in HPI or Assessment and Plan.  Objective  Well appearing patient in no apparent distress; mood and affect are within normal limits.  A focused examination was performed of the following areas: Right leg, left leg, face   Relevant exam findings are noted in the Assessment and Plan.  right leg x 2, left leg x 6, left cheek x 1 (9) Erythematous stuck-on, waxy papule or plaque    Assessment & Plan     Inflamed seborrheic keratosis (9) right leg x 2, left leg x 6, left cheek x 1  Symptomatic, irritating, patient would like treated.  Destruction of lesion - right leg x 2, left leg x 6, left cheek x 1 (9) Complexity: simple   Destruction method: cryotherapy   Informed consent: discussed and consent obtained   Timeout:  patient name, date of birth, surgical site, and procedure verified Lesion destroyed using liquid nitrogen: Yes   Region frozen until ice ball extended beyond lesion: Yes   Outcome: patient tolerated procedure well with no complications   Post-procedure details: wound care instructions given     SEBORRHEIC KERATOSIS - Stuck-on, waxy, tan-brown papules and/or plaques  - Benign-appearing - Discussed benign etiology and prognosis. - Observe - Call for any changes   ACTINIC DAMAGE - chronic, secondary to cumulative UV radiation exposure/sun exposure over time - diffuse scaly erythematous macules with underlying dyspigmentation - Recommend daily broad spectrum  sunscreen SPF 30+ to sun-exposed areas, reapply every 2 hours as needed.  - Recommend staying in the shade or wearing long sleeves, sun glasses (UVA+UVB protection) and wide brim hats (4-inch brim around the entire circumference of the hat). - Call for new or changing lesions.  Return for schedule tbse .  IAsher Muir, CMA, am acting as scribe for Armida Sans, MD.   Documentation: I have reviewed the above documentation for accuracy and completeness, and I agree with the above.  Armida Sans, MD

## 2022-12-24 NOTE — Patient Instructions (Addendum)

## 2023-01-01 ENCOUNTER — Encounter: Payer: Self-pay | Admitting: Dermatology

## 2023-01-22 ENCOUNTER — Other Ambulatory Visit: Payer: Self-pay | Admitting: Internal Medicine

## 2023-01-22 DIAGNOSIS — Z0000 Encounter for general adult medical examination without abnormal findings: Secondary | ICD-10-CM | POA: Diagnosis not present

## 2023-01-22 DIAGNOSIS — E079 Disorder of thyroid, unspecified: Secondary | ICD-10-CM | POA: Diagnosis not present

## 2023-01-22 DIAGNOSIS — Z79899 Other long term (current) drug therapy: Secondary | ICD-10-CM | POA: Diagnosis not present

## 2023-01-22 DIAGNOSIS — I10 Essential (primary) hypertension: Secondary | ICD-10-CM | POA: Diagnosis not present

## 2023-01-22 DIAGNOSIS — E785 Hyperlipidemia, unspecified: Secondary | ICD-10-CM | POA: Diagnosis not present

## 2023-01-22 DIAGNOSIS — Z1231 Encounter for screening mammogram for malignant neoplasm of breast: Secondary | ICD-10-CM

## 2023-02-15 DIAGNOSIS — Z23 Encounter for immunization: Secondary | ICD-10-CM | POA: Diagnosis not present

## 2023-06-11 ENCOUNTER — Ambulatory Visit
Admission: RE | Admit: 2023-06-11 | Discharge: 2023-06-11 | Disposition: A | Discharge status: Home / Self Care | Payer: Medicare HMO | Source: Ambulatory Visit | Attending: Internal Medicine | Admitting: Internal Medicine

## 2023-06-11 DIAGNOSIS — Z1231 Encounter for screening mammogram for malignant neoplasm of breast: Secondary | ICD-10-CM | POA: Diagnosis not present

## 2023-07-05 ENCOUNTER — Ambulatory Visit: Payer: Medicare HMO | Admitting: Dermatology

## 2023-07-08 ENCOUNTER — Ambulatory Visit: Payer: Medicare HMO | Admitting: Dermatology

## 2023-07-30 DIAGNOSIS — M8580 Other specified disorders of bone density and structure, unspecified site: Secondary | ICD-10-CM | POA: Diagnosis not present

## 2023-07-30 DIAGNOSIS — I10 Essential (primary) hypertension: Secondary | ICD-10-CM | POA: Diagnosis not present

## 2023-07-30 DIAGNOSIS — E785 Hyperlipidemia, unspecified: Secondary | ICD-10-CM | POA: Diagnosis not present

## 2023-07-30 DIAGNOSIS — Z0000 Encounter for general adult medical examination without abnormal findings: Secondary | ICD-10-CM | POA: Diagnosis not present

## 2023-07-30 DIAGNOSIS — Z79899 Other long term (current) drug therapy: Secondary | ICD-10-CM | POA: Diagnosis not present

## 2023-07-30 DIAGNOSIS — E079 Disorder of thyroid, unspecified: Secondary | ICD-10-CM | POA: Diagnosis not present

## 2023-08-06 DIAGNOSIS — M8588 Other specified disorders of bone density and structure, other site: Secondary | ICD-10-CM | POA: Diagnosis not present

## 2023-08-13 DIAGNOSIS — Z0100 Encounter for examination of eyes and vision without abnormal findings: Secondary | ICD-10-CM | POA: Diagnosis not present

## 2023-08-13 DIAGNOSIS — H269 Unspecified cataract: Secondary | ICD-10-CM | POA: Diagnosis not present

## 2023-11-24 DIAGNOSIS — Z79899 Other long term (current) drug therapy: Secondary | ICD-10-CM | POA: Diagnosis not present

## 2023-11-24 DIAGNOSIS — E039 Hypothyroidism, unspecified: Secondary | ICD-10-CM | POA: Diagnosis not present

## 2023-11-24 DIAGNOSIS — E7800 Pure hypercholesterolemia, unspecified: Secondary | ICD-10-CM | POA: Diagnosis not present

## 2023-11-24 DIAGNOSIS — E66811 Obesity, class 1: Secondary | ICD-10-CM | POA: Diagnosis not present

## 2023-11-24 DIAGNOSIS — Z6830 Body mass index (BMI) 30.0-30.9, adult: Secondary | ICD-10-CM | POA: Diagnosis not present

## 2023-11-24 DIAGNOSIS — I10 Essential (primary) hypertension: Secondary | ICD-10-CM | POA: Diagnosis not present

## 2023-11-24 DIAGNOSIS — E6609 Other obesity due to excess calories: Secondary | ICD-10-CM | POA: Diagnosis not present

## 2024-02-25 DIAGNOSIS — Z79899 Other long term (current) drug therapy: Secondary | ICD-10-CM | POA: Diagnosis not present

## 2024-02-25 DIAGNOSIS — E039 Hypothyroidism, unspecified: Secondary | ICD-10-CM | POA: Diagnosis not present

## 2024-02-25 DIAGNOSIS — E7800 Pure hypercholesterolemia, unspecified: Secondary | ICD-10-CM | POA: Diagnosis not present

## 2024-02-29 DIAGNOSIS — I10 Essential (primary) hypertension: Secondary | ICD-10-CM | POA: Diagnosis not present

## 2024-02-29 DIAGNOSIS — E039 Hypothyroidism, unspecified: Secondary | ICD-10-CM | POA: Diagnosis not present

## 2024-02-29 DIAGNOSIS — Z1211 Encounter for screening for malignant neoplasm of colon: Secondary | ICD-10-CM | POA: Diagnosis not present

## 2024-02-29 DIAGNOSIS — Z6830 Body mass index (BMI) 30.0-30.9, adult: Secondary | ICD-10-CM | POA: Diagnosis not present

## 2024-02-29 DIAGNOSIS — E7800 Pure hypercholesterolemia, unspecified: Secondary | ICD-10-CM | POA: Diagnosis not present

## 2024-02-29 DIAGNOSIS — E6609 Other obesity due to excess calories: Secondary | ICD-10-CM | POA: Diagnosis not present

## 2024-02-29 DIAGNOSIS — E66811 Obesity, class 1: Secondary | ICD-10-CM | POA: Diagnosis not present
